# Patient Record
Sex: Female | Born: 1945
Health system: Southern US, Community
[De-identification: ages and names within clinical notes are randomized; demographics above are authoritative.]

## PROBLEM LIST (undated history)

## (undated) DIAGNOSIS — I471 Supraventricular tachycardia, unspecified: Secondary | ICD-10-CM

## (undated) DIAGNOSIS — E785 Hyperlipidemia, unspecified: Secondary | ICD-10-CM

## (undated) DIAGNOSIS — R001 Bradycardia, unspecified: Secondary | ICD-10-CM

## (undated) DIAGNOSIS — E559 Vitamin D deficiency, unspecified: Secondary | ICD-10-CM

## (undated) DIAGNOSIS — R413 Other amnesia: Secondary | ICD-10-CM

## (undated) DIAGNOSIS — I10 Essential (primary) hypertension: Secondary | ICD-10-CM

## (undated) DIAGNOSIS — F0390 Unspecified dementia without behavioral disturbance: Secondary | ICD-10-CM

## (undated) DIAGNOSIS — D649 Anemia, unspecified: Secondary | ICD-10-CM

## (undated) DIAGNOSIS — E041 Nontoxic single thyroid nodule: Secondary | ICD-10-CM

## (undated) HISTORY — DX: Nontoxic single thyroid nodule: E04.1

## (undated) HISTORY — DX: Bradycardia, unspecified: R00.1

## (undated) HISTORY — DX: Essential (primary) hypertension: I10

## (undated) HISTORY — DX: Supraventricular tachycardia: I47.1

## (undated) HISTORY — DX: Unspecified dementia, unspecified severity, without behavioral disturbance, psychotic disturbance, mood disturbance, and anxiety: F03.90

## (undated) HISTORY — DX: Anemia, unspecified: D64.9

## (undated) HISTORY — DX: Other amnesia: R41.3

## (undated) HISTORY — DX: Supraventricular tachycardia, unspecified: I47.10

## (undated) HISTORY — DX: Hyperlipidemia, unspecified: E78.5

## (undated) HISTORY — DX: Vitamin D deficiency, unspecified: E55.9

## (undated) HISTORY — PX: OTHER SURGICAL HISTORY: SHX169

---

## 2012-09-16 ENCOUNTER — Encounter: Payer: Self-pay | Admitting: Cardiology

## 2012-09-18 ENCOUNTER — Ambulatory Visit (INDEPENDENT_AMBULATORY_CARE_PROVIDER_SITE_OTHER): Payer: Medicare Other | Admitting: Cardiology

## 2012-09-18 ENCOUNTER — Encounter: Payer: Self-pay | Admitting: Cardiology

## 2012-09-18 VITALS — BP 142/84 | HR 60 | Ht 62.0 in | Wt 151.0 lb

## 2012-09-18 DIAGNOSIS — I10 Essential (primary) hypertension: Secondary | ICD-10-CM

## 2012-09-18 DIAGNOSIS — I471 Supraventricular tachycardia: Secondary | ICD-10-CM

## 2012-09-18 DIAGNOSIS — R002 Palpitations: Secondary | ICD-10-CM | POA: Insufficient documentation

## 2012-09-18 DIAGNOSIS — I498 Other specified cardiac arrhythmias: Secondary | ICD-10-CM

## 2012-09-18 NOTE — Progress Notes (Signed)
  HPI: 66 year old female for evaluation of palpitations. Patient apparently has a history of supraventricular tachycardia. I do not have those records available. Previous echocardiogram in 1998 showed normal LV function. Patient has had intermittent palpitations since her 26s. These are frequent and can occur up to once per week. They're sudden in onset and described as her heart racing. She feels somewhat weak and a numb sensation in her head but there is no syncope, chest pain or dyspnea. The length of these episodes varies. She denies dyspnea on exertion, orthopnea, PND, pedal edema or exertional chest pain. Because of the above we were asked to evaluate.  Current Outpatient Prescriptions  Medication Sig Dispense Refill  . lisinopril-hydrochlorothiazide (PRINZIDE,ZESTORETIC) 20-25 MG per tablet Take 1 tablet by mouth daily.      . methocarbamol (ROBAXIN) 500 MG tablet Take 250-500 mg by mouth at bedtime as needed.      . potassium chloride (K-DUR,KLOR-CON) 10 MEQ tablet Take 10 mEq by mouth 2 (two) times daily.         No Known Allergies  Past Medical History  Diagnosis Date  . Hypertension   . Tachycardia   . Hyperlipidemia   . Thyroid nodule     Past Surgical History  Procedure Date  . Hysterectomy----unknown     History   Social History  . Marital Status: Married    Spouse Name: N/A    Number of Children: 3  . Years of Education: N/A   Occupational History  . RETIRED    Social History Main Topics  . Smoking status: Former Smoker -- 0.5 packs/day for 10 years    Types: Cigarettes    Quit date: 09/18/1977  . Smokeless tobacco: Not on file  . Alcohol Use: No  . Drug Use: No  . Sexually Active: Not on file   Other Topics Concern  . Not on file   Social History Narrative  . No narrative on file    Family History  Problem Relation Age of Onset  . Ovarian cancer Sister   . Lung cancer Brother   . Heart disease Mother     Rhythm problem    ROS: no fevers or  chills, productive cough, hemoptysis, dysphasia, odynophagia, melena, hematochezia, dysuria, hematuria, rash, seizure activity, orthopnea, PND, pedal edema, claudication. Remaining systems are negative.  Physical Exam:   Blood pressure 142/84, pulse 60, height 5\' 2"  (1.575 m), weight 151 lb (68.493 kg).  General:  Well developed/well nourished in NAD Skin warm/dry Patient not depressed No peripheral clubbing Back-normal HEENT-normal/normal eyelids Neck supple/normal carotid upstroke bilaterally; no bruits; no JVD; no thyromegaly chest - CTA/ normal expansion CV - RRR/normal S1 and S2; no murmurs, rubs or gallops;  PMI nondisplaced Abdomen -NT/ND, no HSM, no mass, + bowel sounds, no bruit 2+ femoral pulses, no bruits Ext-no edema, chords, 2+ DP Neuro-grossly nonfocal  ECG 08/23/2012-sinus bradycardia at a rate of 46 and nonspecific ST changes.

## 2012-09-18 NOTE — Assessment & Plan Note (Signed)
Patient symptoms are concerning for supraventricular tachycardia. She apparently has been diagnosed with this in the past but we do not have those records. Plan echocardiogram to assess LV function. CardioNet to identify arrhythmia. If SVT identified she will need an ablation. I am unable had a beta blocker as her resting heart rate previously was in the 40s.

## 2012-09-18 NOTE — Patient Instructions (Signed)
   E-cardio 21 day heart monitor  Echo  Office will contact with results Continue all current medications. Follow up in  6 weeks

## 2012-09-18 NOTE — Assessment & Plan Note (Signed)
Continue present blood pressure medications. 

## 2012-09-26 ENCOUNTER — Other Ambulatory Visit (INDEPENDENT_AMBULATORY_CARE_PROVIDER_SITE_OTHER): Payer: Medicare Other

## 2012-09-26 ENCOUNTER — Other Ambulatory Visit: Payer: Self-pay

## 2012-09-26 DIAGNOSIS — I471 Supraventricular tachycardia: Secondary | ICD-10-CM

## 2012-09-26 DIAGNOSIS — I369 Nonrheumatic tricuspid valve disorder, unspecified: Secondary | ICD-10-CM

## 2012-09-26 DIAGNOSIS — R002 Palpitations: Secondary | ICD-10-CM

## 2012-09-27 ENCOUNTER — Encounter: Payer: Self-pay | Admitting: *Deleted

## 2012-11-12 ENCOUNTER — Ambulatory Visit: Payer: Medicare Other | Admitting: Cardiology

## 2012-11-14 ENCOUNTER — Encounter: Payer: Self-pay | Admitting: Cardiology

## 2012-11-14 ENCOUNTER — Ambulatory Visit (INDEPENDENT_AMBULATORY_CARE_PROVIDER_SITE_OTHER): Payer: Medicare Other | Admitting: Cardiology

## 2012-11-14 VITALS — BP 161/88 | HR 79 | Ht 62.0 in | Wt 155.0 lb

## 2012-11-14 DIAGNOSIS — R002 Palpitations: Secondary | ICD-10-CM

## 2012-11-14 NOTE — Progress Notes (Signed)
   HPI: 67 year old female I initially saw in Nov 2013 for evaluation of palpitations. Patient apparently has a history of supraventricular tachycardia. I do not have those records available. Echocardiogram in November of 2013 showed an ejection fraction of 65-70%, mild left ventricular hypertrophy, grade 1 diastolic dysfunction, mild left atrial enlargement, mild mitral regurgitation and trace aortic/mitral insufficiency. Event monitor ordered but not performed. Since I last saw her she continues to have occasional palpitations. She describes this as a fluttering sensation in her neck. It can last several hours to all day. She otherwise denies dyspnea on exertion, orthopnea, PND, chest pain or syncope.   Current Outpatient Prescriptions  Medication Sig Dispense Refill  . lisinopril-hydrochlorothiazide (PRINZIDE,ZESTORETIC) 20-25 MG per tablet Take 1 tablet by mouth daily.      . methocarbamol (ROBAXIN) 500 MG tablet Take 250-500 mg by mouth at bedtime as needed.      . potassium chloride (K-DUR,KLOR-CON) 10 MEQ tablet Take 10 mEq by mouth 2 (two) times daily.          Past Medical History  Diagnosis Date  . Hypertension   . Tachycardia   . Hyperlipidemia   . Thyroid nodule     Past Surgical History  Procedure Date  . Hysterectomy----unknown     History   Social History  . Marital Status: Married    Spouse Name: N/A    Number of Children: 3  . Years of Education: N/A   Occupational History  . RETIRED    Social History Main Topics  . Smoking status: Former Smoker -- 0.5 packs/day for 10 years    Types: Cigarettes    Quit date: 09/18/1977  . Smokeless tobacco: Not on file  . Alcohol Use: No  . Drug Use: No  . Sexually Active: Not on file   Other Topics Concern  . Not on file   Social History Narrative  . No narrative on file    ROS: Arthralgias but no fevers or chills, productive cough, hemoptysis, dysphasia, odynophagia, melena, hematochezia, dysuria, hematuria,  rash, seizure activity, orthopnea, PND, pedal edema, claudication. Remaining systems are negative.  Physical Exam: Well-developed well-nourished in no acute distress.  Skin is warm and dry.  HEENT is normal.  Neck is supple.  Chest is clear to auscultation with normal expansion.  Cardiovascular exam is regular rate and rhythm.  Abdominal exam nontender or distended. No masses palpated. Extremities show no edema. neuro grossly intact

## 2012-11-14 NOTE — Assessment & Plan Note (Signed)
Patient continues to have intermittent palpitations. She sounds as though she may be having SVT. LV function is normal. Previous monitor not performed. Will arrange a 21 day CardioNet. If she has SVT she would benefit from ablation. I have not added a beta blocker as her heart rate was in the 40s previously.

## 2012-11-14 NOTE — Assessment & Plan Note (Addendum)
Blood pressure mildly elevated. Continue present medications. Monitor and increase as needed.

## 2012-11-14 NOTE — Patient Instructions (Signed)
   21 day e-cardio heart monitor  Office will contact with results Follow up in  4 weeks

## 2012-11-15 ENCOUNTER — Other Ambulatory Visit: Payer: Self-pay | Admitting: *Deleted

## 2012-11-15 DIAGNOSIS — R002 Palpitations: Secondary | ICD-10-CM

## 2012-11-20 DIAGNOSIS — R002 Palpitations: Secondary | ICD-10-CM

## 2012-11-29 ENCOUNTER — Telehealth: Payer: Self-pay | Admitting: *Deleted

## 2012-11-29 MED ORDER — METOPROLOL TARTRATE 25 MG PO TABS: 25.0000 mg | ORAL_TABLET | Freq: Two times a day (BID) | ORAL | Status: DC

## 2012-11-29 NOTE — Telephone Encounter (Signed)
E-Cardio - monitor reading SVT with heart rate of 163 on 11/28/2012 at 11:11 a.m.   Phone call to patient - stated she did feel slightly dizzy and head felt dull during episode.  No other complaints at this time and states she is feeling fine now.    Gene Serpe, PA notified of above & strips reviewed.  Begin Lopressor 25mg  twice a day.  Follow up at [facility] on 2/3.    Patient informed of above and verbalized understanding.  Will send new rx to [facility] [place].

## 2012-12-16 ENCOUNTER — Ambulatory Visit (INDEPENDENT_AMBULATORY_CARE_PROVIDER_SITE_OTHER): Payer: Medicare Other | Admitting: Physician Assistant

## 2012-12-16 ENCOUNTER — Encounter: Payer: Self-pay | Admitting: Physician Assistant

## 2012-12-16 VITALS — BP 172/82 | HR 43 | Ht 62.0 in | Wt 153.0 lb

## 2012-12-16 DIAGNOSIS — I471 Supraventricular tachycardia, unspecified: Secondary | ICD-10-CM | POA: Insufficient documentation

## 2012-12-16 DIAGNOSIS — R001 Bradycardia, unspecified: Secondary | ICD-10-CM

## 2012-12-16 DIAGNOSIS — I10 Essential (primary) hypertension: Secondary | ICD-10-CM

## 2012-12-16 DIAGNOSIS — I498 Other specified cardiac arrhythmias: Secondary | ICD-10-CM

## 2012-12-16 NOTE — Patient Instructions (Signed)
Continue all current medications. Referral to Dr. Johney Frame - [place] office Follow up as needed

## 2012-12-16 NOTE — Assessment & Plan Note (Signed)
Followed by primary M.D. 

## 2012-12-16 NOTE — Assessment & Plan Note (Signed)
Results of recent event monitor were reviewed with Dr. Myrtis Ser, who confirmed that patient had demonstrated bursts of SVT. Recommend referral to Dr. Hillis Range of our EP team for confirmation of the tachyarrhythmia, as well as further recommendations regarding treatment options, including possible RF ablation.

## 2012-12-16 NOTE — Progress Notes (Signed)
Primary Cardiologist: Olga Millers, MD   HPI: Schedule one-month followup for review of event monitor results, per Dr. Jens Som, for evaluation of palpitations.  Patient reports significant decrease in frequency and duration of palpitations, since I started her on Lopressor 25 twice a day. This was following review of the event monitor results, which clearly demonstrated bursts of narrow complex tachycardia ranging from 145-185 bpm. Since starting the beta blocker, she has had only one prolonged episode of approximately 15 seconds, with no significant associated symptoms. She denies any near-syncope/syncope.   Twelve-lead EKG today, reviewed by me, reveals marked SB 43 bpm; LAD  No Known Allergies  Current Outpatient Prescriptions  Medication Sig Dispense Refill  . lisinopril-hydrochlorothiazide (PRINZIDE,ZESTORETIC) 20-25 MG per tablet Take 1 tablet by mouth daily.      . methocarbamol (ROBAXIN) 500 MG tablet Take 250-500 mg by mouth at bedtime as needed.      . metoprolol tartrate (LOPRESSOR) 25 MG tablet Take 1 tablet (25 mg total) by mouth 2 (two) times daily.  60 tablet  3  . Multiple Vitamins-Minerals (MULTIVITAMIN PO) Take 1 tablet by mouth daily.      . potassium chloride (K-DUR,KLOR-CON) 10 MEQ tablet Take 10 mEq by mouth 2 (two) times daily.         Past Medical History  Diagnosis Date  . Hypertension   . Sinus bradycardia   . Hyperlipidemia   . Thyroid nodule   . SVT (supraventricular tachycardia)     Event monitor, 11/2012    Past Surgical History  Procedure Date  . Hysterectomy----unknown     History   Social History  . Marital Status: Married    Spouse Name: N/A    Number of Children: 3  . Years of Education: N/A   Occupational History  . RETIRED    Social History Main Topics  . Smoking status: Former Smoker -- 0.5 packs/day for 10 years    Types: Cigarettes    Quit date: 09/18/1977  . Smokeless tobacco: Not on file  . Alcohol Use: No  . Drug Use:  No  . Sexually Active: Not on file   Other Topics Concern  . Not on file   Social History Narrative  . No narrative on file    Family History  Problem Relation Age of Onset  . Ovarian cancer Sister   . Lung cancer Brother   . Heart disease Mother     Rhythm problem    ROS: no nausea, vomiting; no fever, chills; no melena, hematochezia; no claudication  PHYSICAL EXAM: BP 172/82  Pulse 43  Ht 5\' 2"  (1.575 m)  Wt 153 lb (69.4 kg)  BMI 27.98 kg/m2  SpO2 97% GENERAL: 67 year old female; NAD HEENT: NCAT, PERRLA, EOMI; sclera clear; no xanthelasma NECK: palpable bilateral carotid pulses, no bruits; no JVD; no TM LUNGS: CTA bilaterally CARDIAC: RRR (S1, S2); no significant murmurs; no rubs or gallops ABDOMEN: soft, non-tender; intact BS EXTREMETIES: no significant peripheral edema SKIN: warm/dry; no obvious rash/lesions MUSCULOSKELETAL: no joint deformity NEURO: no focal deficit; NL affect   EKG: reviewed and available in Electronic Records   ASSESSMENT & PLAN:  SVT (supraventricular tachycardia) Results of recent event monitor were reviewed with Dr. Myrtis Ser, who confirmed that patient had demonstrated bursts of SVT. Recommend referral to Dr. Hillis Range of our EP team for confirmation of the tachyarrhythmia, as well as further recommendations regarding treatment options, including possible RF ablation.  Sinus bradycardia Will decrease Lopressor to 12.5 twice a day, given  development of marked SB in the 40s. Of note, however, patient does report significant improvement of her tachycardia palpitations, since being placed on this medication.  Hypertension Followed by primary M.D.    Gene Jaspreet Hollings, PAC

## 2012-12-16 NOTE — Assessment & Plan Note (Signed)
Will decrease Lopressor to 12.5 twice a day, given development of marked SB in the 40s. Of note, however, patient does report significant improvement of her tachycardia palpitations, since being placed on this medication.

## 2012-12-31 ENCOUNTER — Encounter: Payer: Self-pay | Admitting: Internal Medicine

## 2012-12-31 ENCOUNTER — Ambulatory Visit (INDEPENDENT_AMBULATORY_CARE_PROVIDER_SITE_OTHER): Payer: Medicare Other | Admitting: Internal Medicine

## 2012-12-31 VITALS — BP 187/85 | HR 51 | Ht 61.0 in | Wt 153.0 lb

## 2012-12-31 DIAGNOSIS — I498 Other specified cardiac arrhythmias: Secondary | ICD-10-CM

## 2012-12-31 DIAGNOSIS — I471 Supraventricular tachycardia: Secondary | ICD-10-CM

## 2012-12-31 DIAGNOSIS — I10 Essential (primary) hypertension: Secondary | ICD-10-CM

## 2012-12-31 MED ORDER — AMLODIPINE BESYLATE 2.5 MG PO TABS: 2.5000 mg | ORAL_TABLET | Freq: Every day | ORAL | Status: AC

## 2012-12-31 NOTE — Progress Notes (Signed)
ELECTROPHYSIOLOGY CONSULT NOTE  Patient ID: Taylor Perkins, MRN: [id number], DOB/AGE: [birth year] 67 y.o. Admit date: (Not on file) Date of Consult: 12/31/2012  Primary Physician: Louie Boston, MD Primary Cardiologist: Jonita Albee  Chief Complaint:  SVT    HPI Taylor Perkins is a 67 y.o. female *    ** Past Medical History  Diagnosis Date  . Hypertension   . Sinus bradycardia   . Hyperlipidemia   . Thyroid nodule   . SVT (supraventricular tachycardia)     Event monitor, 11/2012      Surgical History:  Past Surgical History  Procedure Laterality Date  . Hysterectomy----unknown       Home Meds: Prior to Admission medications   Medication Sig Start Date End Date Taking? Authorizing Provider  lisinopril-hydrochlorothiazide (PRINZIDE,ZESTORETIC) 20-25 MG per tablet Take 1 tablet by mouth daily.   Yes Historical Provider, MD  methocarbamol (ROBAXIN) 500 MG tablet Take 250-500 mg by mouth at bedtime as needed.   Yes Historical Provider, MD  metoprolol tartrate (LOPRESSOR) 25 MG tablet Take 1 tablet (25 mg total) by mouth 2 (two) times daily. 11/29/12  Yes Prescott Parma, PA  Multiple Vitamins-Minerals (MULTIVITAMIN PO) Take 1 tablet by mouth daily.   Yes Historical Provider, MD  potassium chloride (K-DUR,KLOR-CON) 10 MEQ tablet Take 10 mEq by mouth 2 (two) times daily.    Yes Historical Provider, MD     Allergies: No Known Allergies  History   Social History  . Marital Status: Married    Spouse Name: N/A    Number of Children: 3  . Years of Education: N/A   Occupational History  . RETIRED    Social History Main Topics  . Smoking status: Former Smoker -- 0.50 packs/day for 10 years    Types: Cigarettes    Quit date: 09/18/1977  . Smokeless tobacco: Not on file  . Alcohol Use: No  . Drug Use: No  . Sexually Active: Not on file   Other Topics Concern  . Not on file   Social History Narrative  . No narrative on file     Family History  Problem Relation  Age of Onset  . Ovarian cancer Sister   . Lung cancer Brother   . Heart disease Mother     Rhythm problem     ROS:  Please see the history of present illness.   Negative except arthritis and exotropia  All other systems reviewed and negative.    Physical Exam:   Blood pressure 187/85, pulse 51, height 5\' 1"  (1.549 m), weight 153 lb (69.4 kg). General: Well developed, well nourished female in no acute distress. Head: Normocephalic, atraumatic, sclera non-icteric, no xanthomas, nares are without discharge. EENT exotropia Lymph Nodes:  none Back: without scoliosis/kyphosis , no CVA tendersness Neck: Negative for carotid bruits. JVD not elevated. Lungs: Clear bilaterally to auscultation without wheezes, rales, or rhonchi. Breathing is unlabored. Heart: RRR with S1 S2. No  murmur , rubs,+s4 Abdomen: Soft, non-tender, non-distended with normoactive bowel sounds. No hepatomegaly. No rebound/guarding. No obvious abdominal masses. Msk:  Strength and tone appear normal for age. Extremities: No clubbing or cyanosis. No edema.  Distal pedal pulses are 2+ and equal bilaterally. Skin: Warm and Dry Neuro: Alert and oriented X 3. CN III-XII intact Grossly normal sensory and motor function . Psych:  Responds to questions appropriately with a normal affect.      Labs:   Radiology/Studies:  No results found.  EKG:  NSR without preexcitation  Assessment and Plan:    Sherryl Manges

## 2012-12-31 NOTE — Assessment & Plan Note (Signed)
Poorly controlled will add amlodipine and she will follow up with her PCP

## 2012-12-31 NOTE — Assessment & Plan Note (Signed)
Discussed treatment options including ongoing medications and catheter ablation. We discussed that treatment is life style choice and that the SVT is unlikely to be lifethreatening.  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

## 2012-12-31 NOTE — Patient Instructions (Addendum)
Your physician wants you to follow-up in: 6 months with Dr Logan Bores will receive a reminder letter in the mail two months in advance. If you don't receive a letter, please call our office to schedule the follow-up appointment.  Your physician has recommended you make the following change in your medication: START Amlodipine 2.5 mg daily

## 2013-04-28 ENCOUNTER — Other Ambulatory Visit: Payer: Self-pay | Admitting: *Deleted

## 2013-04-28 MED ORDER — METOPROLOL TARTRATE 25 MG PO TABS: 25.0000 mg | ORAL_TABLET | Freq: Two times a day (BID) | ORAL | Status: DC

## 2014-01-10 ENCOUNTER — Other Ambulatory Visit: Payer: Self-pay | Admitting: Physician Assistant

## 2014-01-19 ENCOUNTER — Other Ambulatory Visit: Payer: Self-pay | Admitting: Physician Assistant

## 2015-09-06 ENCOUNTER — Ambulatory Visit: Payer: Medicare Other | Admitting: Neurology

## 2016-12-20 ENCOUNTER — Encounter: Payer: Self-pay | Admitting: Neurology

## 2016-12-20 ENCOUNTER — Ambulatory Visit (INDEPENDENT_AMBULATORY_CARE_PROVIDER_SITE_OTHER): Payer: Medicare Other | Admitting: Neurology

## 2016-12-20 ENCOUNTER — Ambulatory Visit: Payer: Medicare Other | Admitting: Neurology

## 2016-12-20 VITALS — BP 170/94 | HR 54 | Ht 61.0 in | Wt 160.5 lb

## 2016-12-20 DIAGNOSIS — R413 Other amnesia: Secondary | ICD-10-CM | POA: Diagnosis not present

## 2016-12-20 DIAGNOSIS — E538 Deficiency of other specified B group vitamins: Secondary | ICD-10-CM | POA: Diagnosis not present

## 2016-12-20 HISTORY — DX: Other amnesia: R41.3

## 2016-12-20 MED ORDER — DONEPEZIL HCL 5 MG PO TABS: 5.0000 mg | ORAL_TABLET | Freq: Every day | ORAL | 1 refills | Status: DC

## 2016-12-20 NOTE — Patient Instructions (Signed)
   We will check blood work today and get MRI of the brain. We will start Aricept for memory.  Begin Aricept (donepezil) at 5 mg at night for one month. If this medication is well-tolerated, please call our office and we will call in a prescription for the 10 mg tablets. Look out for side effects that may include nausea, diarrhea, weight loss, or stomach cramps. This medication will also cause a runny nose, therefore there is no need for allergy medications for this purpose.

## 2016-12-20 NOTE — Progress Notes (Signed)
Reason for visit: Memory disturbance  Referring physician: Dr. Delphina Cahill Taylor Perkins is a 71 y.o. female  History of present illness:  Taylor Perkins is a 70 year old right-handed black female with a history of reported memory problems dating back 6-12 months. The patient has noted that she is forgetful, she cannot remember some things that occurred recently. She has some word finding problems and some difficulty remembering names for people and things. She does operate a Librarian, academic, she denies any safety issues with this, she denies any problems with directions or problems finding her car when she parks in front of a store. The patient manages her own medications and appointments without difficulty. She denies any problems with cooking. She may sometimes misplace things about the house. The patient denies any problems with fatigue, she sleeps well at night. She denies any numbness or weakness of the face, arms, or legs. She does have some urinary urgency and occasional incontinence but this has been a problem that has been present for several years. She denies any balance issues or any falls. She is concerned about her memory, she indicates that her mother and one of her sisters had some memory problems. The patient comes to this office for an evaluation.  Past Medical History:  Diagnosis Date  . Hyperlipidemia   . Hypertension   . Memory disorder 12/20/2016  . Sinus bradycardia   . SVT (supraventricular tachycardia) (HCC)    Event monitor, 11/2012  . Thyroid nodule     Past Surgical History:  Procedure Laterality Date  . hysterectomy----unknown      Family History  Problem Relation Age of Onset  . Ovarian cancer Sister   . Lung cancer Brother   . Heart disease Mother     Rhythm problem  . Dementia Mother   . Dementia Sister     Social history:  reports that she quit smoking about 39 years ago. Her smoking use included Cigarettes. She has a 5.00 pack-year smoking history.  She has never used smokeless tobacco. She reports that she does not drink alcohol or use drugs.  Medications:  Prior to Admission medications   Medication Sig Start Date End Date Taking? Authorizing Provider  amLODipine (NORVASC) 2.5 MG tablet Take 1 tablet (2.5 mg total) by mouth daily. 12/31/12  Yes Duke Salvia, MD  lisinopril-hydrochlorothiazide (PRINZIDE,ZESTORETIC) 20-25 MG per tablet Take 1 tablet by mouth daily.   Yes Historical Provider, MD  methocarbamol (ROBAXIN) 500 MG tablet Take 250-500 mg by mouth at bedtime as needed.   Yes Historical Provider, MD  metoprolol tartrate (LOPRESSOR) 25 MG tablet TAKE ONE TABLET BY MOUTH TWICE DAILY   Yes Duke Salvia, MD  metoprolol tartrate (LOPRESSOR) 25 MG tablet TAKE ONE TABLET BY MOUTH TWICE DAILY 01/19/14  Yes Duke Salvia, MD  Multiple Vitamins-Minerals (MULTIVITAMIN PO) Take 1 tablet by mouth daily.   Yes Historical Provider, MD  potassium chloride (K-DUR,KLOR-CON) 10 MEQ tablet Take 10 mEq by mouth 2 (two) times daily.    Yes Historical Provider, MD  donepezil (ARICEPT) 5 MG tablet Take 1 tablet (5 mg total) by mouth at bedtime. 12/20/16   York Spaniel, MD     No Known Allergies  ROS:  Out of a complete 14 system review of symptoms, the patient complains only of the following symptoms, and all other reviewed systems are negative.  Memory loss Change in appetite  Blood pressure (!) 170/94, pulse (!) 54, height 5\' 1"  (1.549 m),  weight 160 lb 8 oz (72.8 kg).  Physical Exam  General: The patient is alert and cooperative at the time of the examination.  Eyes: Pupils are equal, round, and reactive to light. Discs are flat bilaterally.  Neck: The neck is supple, no carotid bruits are noted.  Respiratory: The respiratory examination is clear.  Cardiovascular: The cardiovascular examination reveals a regular rate and rhythm, no obvious murmurs or rubs are noted.  Skin: Extremities are without significant edema.  Neurologic  Exam  Mental status: The patient is alert and oriented x 3 at the time of the examination. The patient has apparent normal recent and remote memory, with an apparently normal attention span and concentration ability. Mini-Mental Status Examination done today shows a total score 25/30. The patient is able to name 5 animals with 4 legs in one minute.  Cranial nerves: Facial symmetry is present. There is good sensation of the face to pinprick and soft touch bilaterally. The strength of the facial muscles and the muscles to head turning and shoulder shrug are normal bilaterally. Speech is well enunciated, no aphasia or dysarthria is noted. Extraocular movements are full with the right eye. On primary gaze, there is exotropia of the left eye, she cannot bring the left eye all the way across to the right. With superior gaze, the left eye is laterally deviated. Visual fields are full. The tongue is midline, and the patient has symmetric elevation of the soft palate. No obvious hearing deficits are noted.  Motor: The motor testing reveals 5 over 5 strength of all 4 extremities. Good symmetric motor tone is noted throughout.  Sensory: Sensory testing is intact to pinprick, soft touch, vibration sensation, and position sense on all 4 extremities. No evidence of extinction is noted.  Coordination: Cerebellar testing reveals good finger-nose-finger and heel-to-shin bilaterally.  Gait and station: Gait is normal. Tandem gait is normal. Romberg is negative. No drift is seen.  Reflexes: Deep tendon reflexes are symmetric, but are depressed bilaterally. Toes are downgoing bilaterally.   Assessment/Plan:  1. Memory disturbance  The patient is developing a memory issue over the last year or so. The patient will be set up for blood work today, she will have MRI of the brain. We will start Aricept in low dose, she will call after one month if she is tolerating the medication and we will call in a maintenance dose  prescription for her. She will follow-up in 6 months.  Marlan Palau. Keith Christena Sunderlin MD 12/20/2016 8:55 AM  [facility] [address] [place], [place] [id number]  Phone [phone] Fax [phone]

## 2016-12-21 LAB — RPR: RPR: NONREACTIVE

## 2016-12-21 LAB — SEDIMENTATION RATE: Sed Rate: 10 mm/hr (ref 0–40)

## 2016-12-21 LAB — VITAMIN B12: Vitamin B-12: 687 pg/mL (ref 232–1245)

## 2016-12-25 ENCOUNTER — Telehealth: Payer: Self-pay | Admitting: Neurology

## 2016-12-25 ENCOUNTER — Telehealth: Payer: Self-pay | Admitting: *Deleted

## 2016-12-25 NOTE — Telephone Encounter (Signed)
I called patient. The patient had [facility]. Study shows mild to moderate small vessel disease, the patient does have a history of hypertension. I have asked her to go on a low-dose aspirin.  C4-5 degenerative changes are also noted. I discussed the results of the MRI with her.  The patient is on Aricept 5 mg, if she tolerates this for 30 days, she is to call for a prescription for the 10 mg tablet.

## 2016-12-25 NOTE — Telephone Encounter (Signed)
Called and spoke to patient. Advised if she is in area, she can drop CD off to be reviewed in our office. She states she lives about an hour away and [facility]. She is happy to drop CD off sometime this week if we cannot get a [facility]. Advised I will call [facility] and see if we can get a copy. She verbalized understanding.

## 2016-12-25 NOTE — Telephone Encounter (Signed)
Taylor Perkins- This is the patient I spoke with you about requesting copy MRI CD/report, thank you!

## 2016-12-25 NOTE — Telephone Encounter (Signed)
Called and spoke to pt about lab results per CW,MD note. She verbalized understanding.  

## 2016-12-25 NOTE — Telephone Encounter (Signed)
Dr Anne Hahnwillis- [facility] in medical records requested MRI CD. They are mailed a copy. I gave you MRI report they faxed over for review, thank you.

## 2016-12-25 NOTE — Telephone Encounter (Signed)
Called and spoke to pt. Advised our medical records department is going to request copy of CD/report. If there are any questions/problems we will call her back. She verbalized understanding.

## 2016-12-25 NOTE — Telephone Encounter (Signed)
Patient had MRI last Friday at [facility] and was given CD of MRI. She wants to know what she should do with the CD.

## 2016-12-25 NOTE — Telephone Encounter (Signed)
-----   Message from York Spanielharles K Willis, MD sent at 12/23/2016 12:28 PM EST -----  The blood work results are unremarkable. Please call the patient.  ----- Message ----- From: Nell RangeInterface, [facility] Lab Results In Sent: 12/21/2016   5:42 AM To: York Spanielharles K Willis, MD

## 2017-06-21 ENCOUNTER — Ambulatory Visit: Payer: Medicare Other | Admitting: Adult Health

## 2017-10-03 ENCOUNTER — Other Ambulatory Visit: Payer: Self-pay | Admitting: Neurology

## 2017-10-03 DIAGNOSIS — R413 Other amnesia: Secondary | ICD-10-CM

## 2017-10-11 ENCOUNTER — Ambulatory Visit
Admission: RE | Admit: 2017-10-11 | Discharge: 2017-10-11 | Disposition: A | Discharge status: Home / Self Care | Payer: Medicare Other | Source: Ambulatory Visit | Attending: Neurology | Admitting: Neurology

## 2017-10-11 DIAGNOSIS — M4802 Spinal stenosis, cervical region: Secondary | ICD-10-CM | POA: Diagnosis not present

## 2017-10-11 DIAGNOSIS — R9082 White matter disease, unspecified: Secondary | ICD-10-CM | POA: Insufficient documentation

## 2017-10-11 DIAGNOSIS — M47892 Other spondylosis, cervical region: Secondary | ICD-10-CM | POA: Diagnosis not present

## 2017-10-11 DIAGNOSIS — R413 Other amnesia: Secondary | ICD-10-CM | POA: Diagnosis present

## 2017-11-13 ENCOUNTER — Other Ambulatory Visit: Payer: Self-pay | Admitting: Neurology

## 2018-08-30 DIAGNOSIS — E7800 Pure hypercholesterolemia, unspecified: Secondary | ICD-10-CM | POA: Diagnosis not present

## 2018-08-30 DIAGNOSIS — Z1159 Encounter for screening for other viral diseases: Secondary | ICD-10-CM | POA: Diagnosis not present

## 2018-08-30 DIAGNOSIS — Z130 Encounter for screening for diseases of the blood and blood-forming organs and certain disorders involving the immune mechanism: Secondary | ICD-10-CM | POA: Diagnosis not present

## 2018-08-30 DIAGNOSIS — Z23 Encounter for immunization: Secondary | ICD-10-CM | POA: Diagnosis not present

## 2018-08-30 DIAGNOSIS — I471 Supraventricular tachycardia: Secondary | ICD-10-CM | POA: Diagnosis not present

## 2018-08-30 DIAGNOSIS — E876 Hypokalemia: Secondary | ICD-10-CM | POA: Diagnosis not present

## 2018-08-30 DIAGNOSIS — Z0000 Encounter for general adult medical examination without abnormal findings: Secondary | ICD-10-CM | POA: Diagnosis not present

## 2018-08-30 DIAGNOSIS — I10 Essential (primary) hypertension: Secondary | ICD-10-CM | POA: Diagnosis not present

## 2018-09-10 IMAGING — MR MR HEAD W/O CM
9 of 10 series · 34 of 48 positions shown · non-contrast
Comparison: None.

CLINICAL DATA: Progressive memory loss over the last 1 year.
Headaches for 3 months.

EXAM:
MRI HEAD WITHOUT CONTRAST
TECHNIQUE: Multiplanar, multiecho pulse sequences of the brain and surrounding
structures were obtained without intravenous contrast.

[Series 2: T1 · sagittal · 5.0mm · 0.47mm/px · 3 of 20 slices shown (1 of 2)]
[im 1/20]
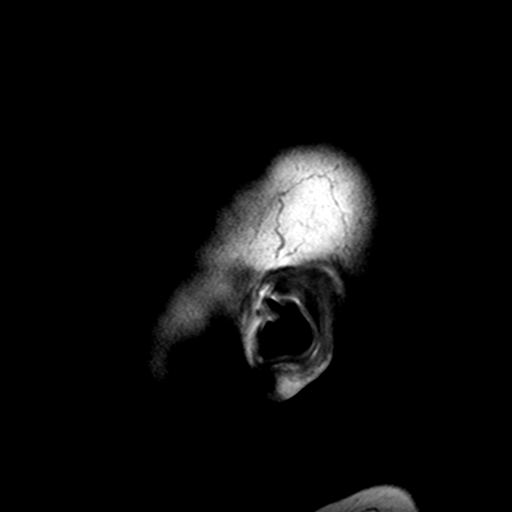
[im 10/20]
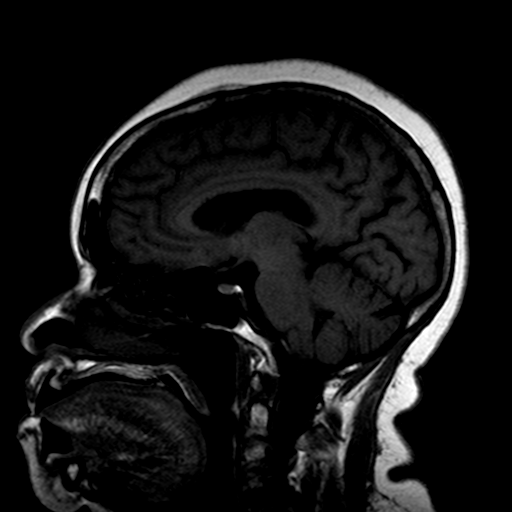
[im 20/20]
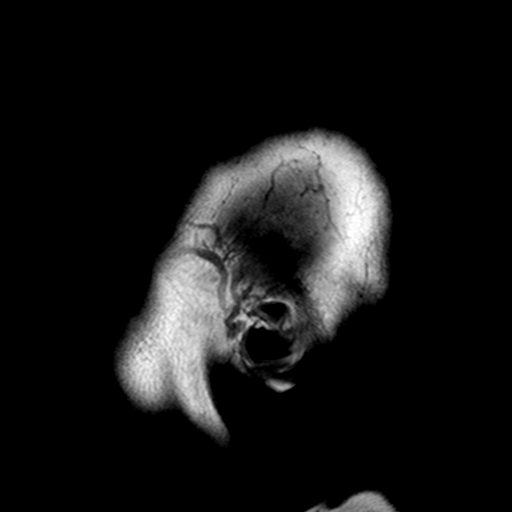

[Series 4: DWI · axial · 3.0mm · 0.94mm/px · z∈[-82,+58]mm · 5 of 48 slices shown (1 of 2)]
[im 1/48]
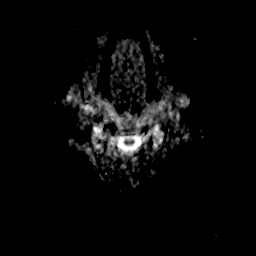
[im 12/48]
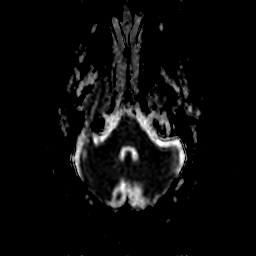
[im 24/48]
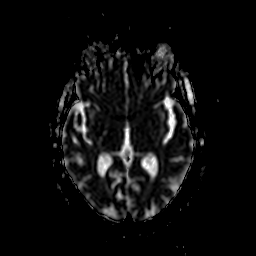
[im 36/48]
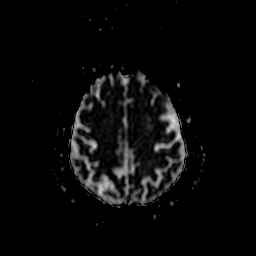
[im 48/48]
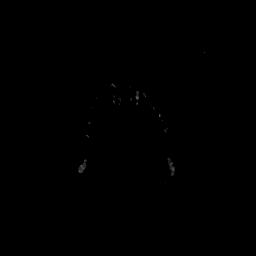

[Series 6: DWI · coronal · 5.0mm · 1.80mm/px · 4 of 35 slices shown (2 of 2)]
[im 1/35]
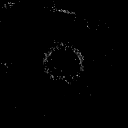
[im 12/35]
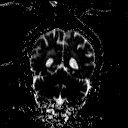
[im 23/35]
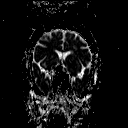
[im 35/35]
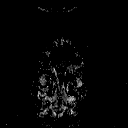

[Series 7: (id) ax · axial · 3.0mm · 0.94mm/px · z∈[-82,-49]mm · 2 of 46 slices shown]
[im 1/46]
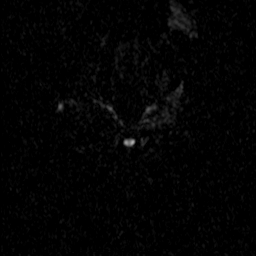
[im 12/46]
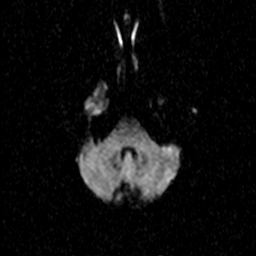

[Series 9: T2 · axial · 5.0mm · 0.45mm/px · z∈[-84,+55]mm · 2 of 21 slices shown (1 of 3)]
[im 1/21]
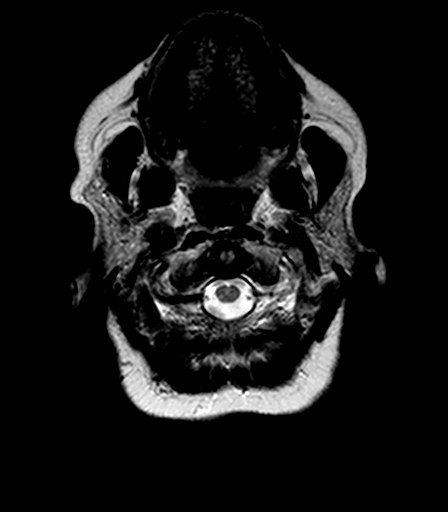
[im 21/21]
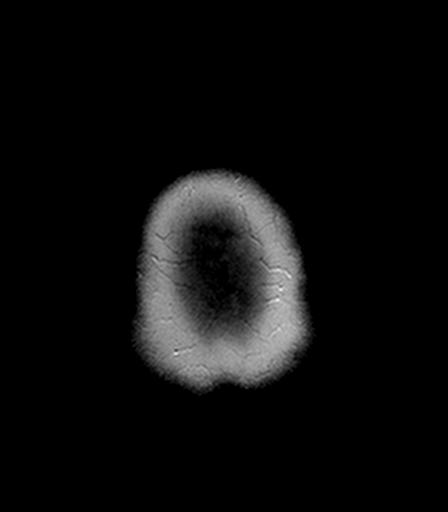

[Series 10: FLAIR · axial · 3.0mm · 0.90mm/px · z∈[-88,+58]mm · 5 of 50 slices shown]
[im 1/50]
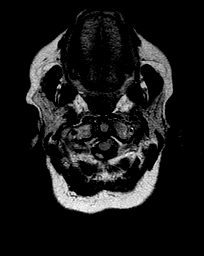
[im 13/50]
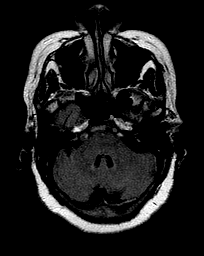
[im 25/50]
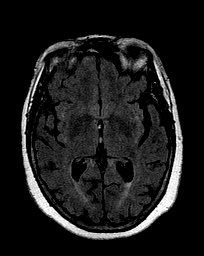
[im 37/50]
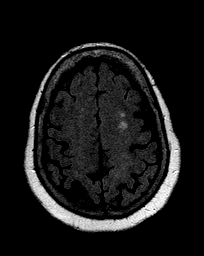
[im 50/50]
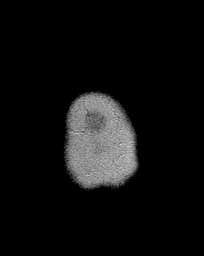

[Series 11: T2 · axial · 5.0mm · 0.45mm/px · z∈[-84,+55]mm · 2 of 21 slices shown (2 of 3)]
[im 1/21]
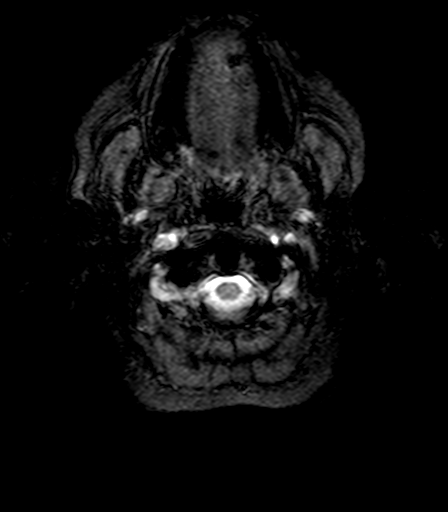
[im 21/21]
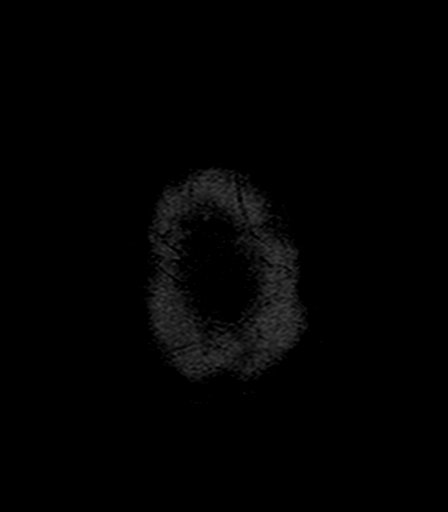

[Series 12: T1 · axial · 1.0mm · 0.45mm/px · z∈[-86,+56]mm · 8 of 144 slices shown (2 of 2)]
[im 1/144]
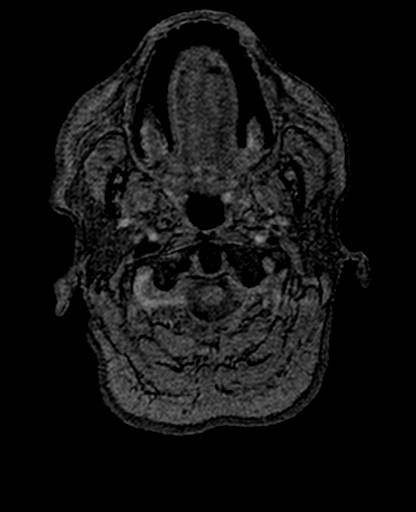
[im 21/144]
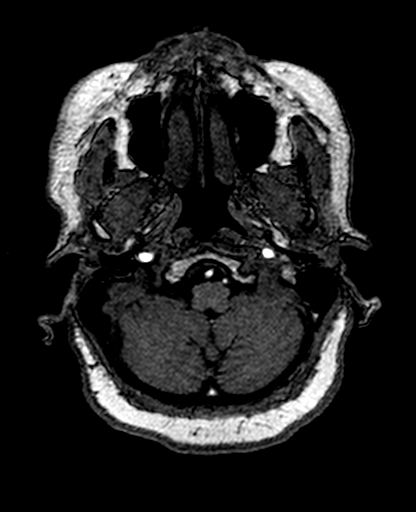
[im 41/144]
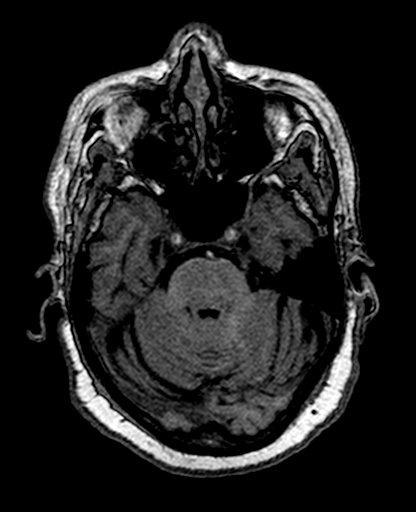
[im 62/144]
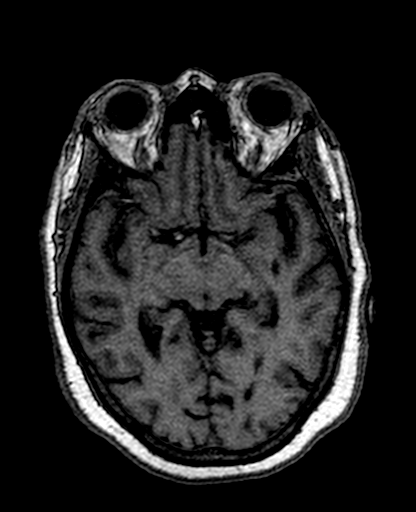
[im 82/144]
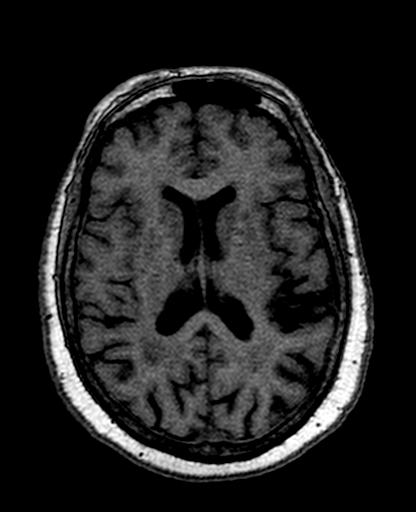
[im 103/144]
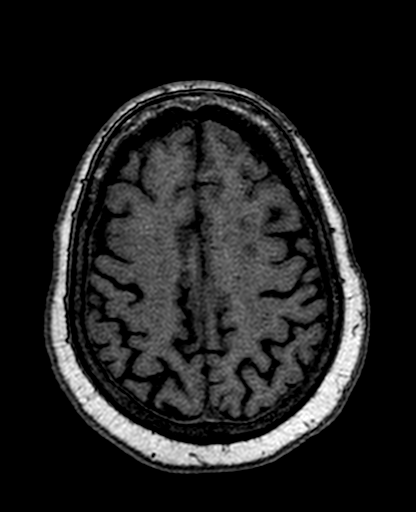
[im 123/144]
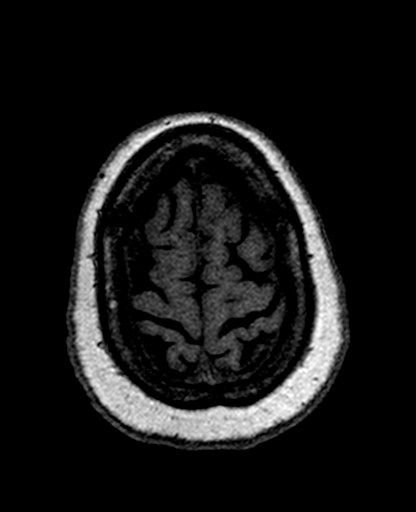
[im 144/144]
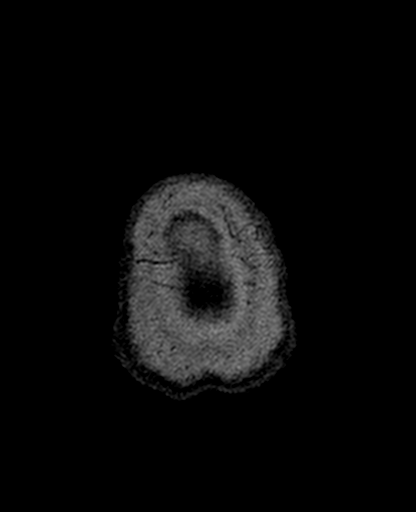

[Series 13: T2 · coronal · 5.0mm · 0.45mm/px · 3 of 27 slices shown (3 of 3)]
[im 1/27]
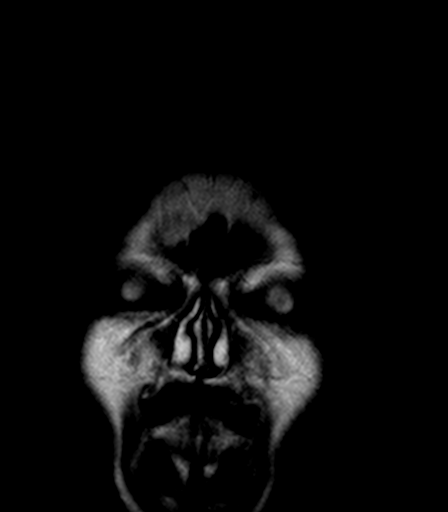
[im 14/27]
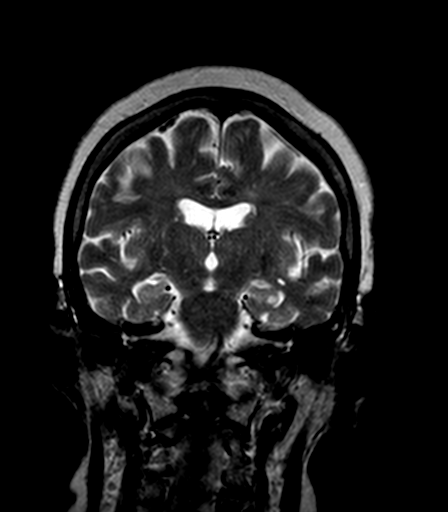
[im 27/27]
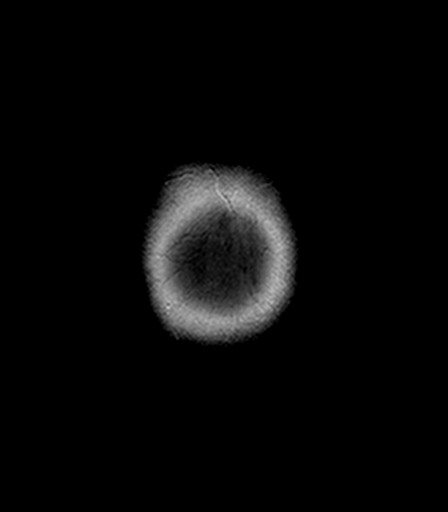

[34 of 48 positions shown; findings below may reference images not displayed]

FINDINGS: Brain: The diffusion-weighted images demonstrate no acute or
subacute infarction. Mild atrophy and moderate white matter disease
is present bilaterally. A remote lacunar infarct is present in the
left lentiform nucleus. Dilated perivascular spaces are present
throughout the basal ganglia. Mild white matter changes extend into
the brainstem. The cerebellum is unremarkable. Subcortical white
matter changes extend into the corona radiata bilaterally.
Ventricles are proportionate to the degree of atrophy. No
significant extra-axial fluid collection is present.

Vascular: Flow is present in the major intracranial arteries.

Skull and upper cervical spine: The craniocervical junction is
normal. Multilevel degenerate changes are noted cervical spine
slight retrolisthesis C3-4. Grade 1 anterolisthesis at C4-5 results
in moderate central canal stenosis. Marrow signal is normal. Midline
sagittal structures are otherwise unremarkable.

Sinuses/Orbits: The paranasal sinuses and mastoid air cells are
clear. Globes and orbits are within normal limits.
IMPRESSION: 1. No acute or focal abnormality to explain progressive memory loss.
2. Moderate diffuse white matter disease, likely related to chronic
microvascular ischemia.
3. Multilevel degenerative changes in the cervical spine with
moderate central canal stenosis at C4-5.

## 2019-04-04 DIAGNOSIS — G301 Alzheimer's disease with late onset: Secondary | ICD-10-CM | POA: Diagnosis not present

## 2019-06-11 DIAGNOSIS — R413 Other amnesia: Secondary | ICD-10-CM | POA: Diagnosis not present

## 2019-06-11 DIAGNOSIS — I10 Essential (primary) hypertension: Secondary | ICD-10-CM | POA: Diagnosis not present

## 2019-09-29 DIAGNOSIS — Z23 Encounter for immunization: Secondary | ICD-10-CM | POA: Diagnosis not present

## 2019-11-05 DIAGNOSIS — E7800 Pure hypercholesterolemia, unspecified: Secondary | ICD-10-CM | POA: Diagnosis not present

## 2019-11-05 DIAGNOSIS — E876 Hypokalemia: Secondary | ICD-10-CM | POA: Diagnosis not present

## 2019-11-05 DIAGNOSIS — I10 Essential (primary) hypertension: Secondary | ICD-10-CM | POA: Diagnosis not present

## 2019-11-05 DIAGNOSIS — Z0000 Encounter for general adult medical examination without abnormal findings: Secondary | ICD-10-CM | POA: Diagnosis not present

## 2019-11-05 DIAGNOSIS — R413 Other amnesia: Secondary | ICD-10-CM | POA: Diagnosis not present

## 2019-11-05 DIAGNOSIS — I471 Supraventricular tachycardia: Secondary | ICD-10-CM | POA: Diagnosis not present

## 2020-11-09 DIAGNOSIS — I471 Supraventricular tachycardia: Secondary | ICD-10-CM | POA: Diagnosis not present

## 2020-11-09 DIAGNOSIS — R7302 Impaired glucose tolerance (oral): Secondary | ICD-10-CM | POA: Diagnosis not present

## 2020-11-09 DIAGNOSIS — Z0000 Encounter for general adult medical examination without abnormal findings: Secondary | ICD-10-CM | POA: Diagnosis not present

## 2020-11-09 DIAGNOSIS — Z13220 Encounter for screening for lipoid disorders: Secondary | ICD-10-CM | POA: Diagnosis not present

## 2020-11-09 DIAGNOSIS — Z136 Encounter for screening for cardiovascular disorders: Secondary | ICD-10-CM | POA: Diagnosis not present

## 2020-11-22 DIAGNOSIS — Z23 Encounter for immunization: Secondary | ICD-10-CM | POA: Diagnosis not present

## 2020-12-08 DIAGNOSIS — Z23 Encounter for immunization: Secondary | ICD-10-CM | POA: Diagnosis not present

## 2021-01-18 DIAGNOSIS — H524 Presbyopia: Secondary | ICD-10-CM | POA: Diagnosis not present

## 2021-05-17 DIAGNOSIS — I471 Supraventricular tachycardia: Secondary | ICD-10-CM | POA: Diagnosis not present

## 2021-05-17 DIAGNOSIS — I10 Essential (primary) hypertension: Secondary | ICD-10-CM | POA: Diagnosis not present

## 2021-05-30 DIAGNOSIS — H401132 Primary open-angle glaucoma, bilateral, moderate stage: Secondary | ICD-10-CM | POA: Diagnosis not present

## 2021-09-05 DIAGNOSIS — H401112 Primary open-angle glaucoma, right eye, moderate stage: Secondary | ICD-10-CM | POA: Diagnosis not present

## 2021-10-10 DIAGNOSIS — Z23 Encounter for immunization: Secondary | ICD-10-CM | POA: Diagnosis not present

## 2021-11-10 DIAGNOSIS — H18413 Arcus senilis, bilateral: Secondary | ICD-10-CM | POA: Diagnosis not present

## 2021-11-10 DIAGNOSIS — H401112 Primary open-angle glaucoma, right eye, moderate stage: Secondary | ICD-10-CM | POA: Diagnosis not present

## 2021-11-23 DIAGNOSIS — R5383 Other fatigue: Secondary | ICD-10-CM | POA: Diagnosis not present

## 2021-11-23 DIAGNOSIS — Z13220 Encounter for screening for lipoid disorders: Secondary | ICD-10-CM | POA: Diagnosis not present

## 2021-11-23 DIAGNOSIS — R7302 Impaired glucose tolerance (oral): Secondary | ICD-10-CM | POA: Diagnosis not present

## 2021-11-23 DIAGNOSIS — I471 Supraventricular tachycardia: Secondary | ICD-10-CM | POA: Diagnosis not present

## 2021-11-23 DIAGNOSIS — Z0000 Encounter for general adult medical examination without abnormal findings: Secondary | ICD-10-CM | POA: Diagnosis not present

## 2021-11-23 DIAGNOSIS — Z136 Encounter for screening for cardiovascular disorders: Secondary | ICD-10-CM | POA: Diagnosis not present

## 2022-12-06 DIAGNOSIS — E7800 Pure hypercholesterolemia, unspecified: Secondary | ICD-10-CM | POA: Diagnosis not present

## 2022-12-06 DIAGNOSIS — D649 Anemia, unspecified: Secondary | ICD-10-CM | POA: Diagnosis not present

## 2022-12-06 DIAGNOSIS — I10 Essential (primary) hypertension: Secondary | ICD-10-CM | POA: Diagnosis not present

## 2022-12-06 DIAGNOSIS — R7303 Prediabetes: Secondary | ICD-10-CM | POA: Diagnosis not present

## 2023-07-15 DEATH — deceased

## 2023-07-25 LAB — AMB RESULTS CONSOLE CBG: Glucose: 109

## 2023-07-25 NOTE — Progress Notes (Signed)
Patient presents to the screening Unit for bp, and glucose screening. She is currently not taking medication for Bp, or Diabetes. No concerns today.

## 2023-08-16 ENCOUNTER — Encounter: Payer: Self-pay | Admitting: *Deleted

## 2023-08-16 NOTE — Progress Notes (Signed)
Pt attended 07/25/23 screening event where her b/p was 138/73 and her blood sugar was 109. At the event, the pt states she had a PCP and pt did not identify any SDOH insecurities. During event f/u call, pt was unable to recall PCP name and stated "we have not been there lately- our regular dr passed away and I don't know the name of our new dr." Call to PCP clinic, [facility] confirms pt is a pt there and her original PCP Dr. Allena Katz did leave and she is currently assigned to Dr. Andee Poles although pt has not yet seen Dr. Deloria Lair. This info correlates with pt's memory that her original PCP left and she had been given another PCP. Chart review indicates pt had last physical exam on 12/06/22 with [facility] in [place], where she saw Dr. Sundra Aland and Dr. Catalina Lunger (who was listed as her PCP), although pt had seen Dr. Wyline Mood 11/23/21 and during 2022. Former PCP notes indicate pt denies memory loss and yet pt has dx of dementia. Chart review also indicates pt's PCP office has made multiple outreach calls, most recent being 08/09/23; but during phone call, pt denies having had any contact from PCP office  "in a while."  Pt states she does not take any medicine and currently has not SDOH needs or healthcare support needs, but confirms she and her husband do indeed have access to their PCP when needed (even though she cannot recall PCP name today.) No additional health equity team support indicated at this time since pt has no SDOH, does have a PCP and access to the PCP, and her event results were WNL and did not indicate a need for immediate f/u, and pt has no current acute healthcare needs.

## 2023-09-20 DIAGNOSIS — R7303 Prediabetes: Secondary | ICD-10-CM | POA: Diagnosis not present

## 2023-09-20 DIAGNOSIS — I10 Essential (primary) hypertension: Secondary | ICD-10-CM | POA: Diagnosis not present

## 2023-09-20 DIAGNOSIS — Z23 Encounter for immunization: Secondary | ICD-10-CM | POA: Diagnosis not present

## 2023-09-20 DIAGNOSIS — D649 Anemia, unspecified: Secondary | ICD-10-CM | POA: Diagnosis not present

## 2023-09-21 DIAGNOSIS — E7800 Pure hypercholesterolemia, unspecified: Secondary | ICD-10-CM | POA: Diagnosis not present

## 2023-09-21 DIAGNOSIS — R9389 Abnormal findings on diagnostic imaging of other specified body structures: Secondary | ICD-10-CM | POA: Diagnosis not present

## 2023-09-21 DIAGNOSIS — R7989 Other specified abnormal findings of blood chemistry: Secondary | ICD-10-CM | POA: Diagnosis not present

## 2023-09-21 DIAGNOSIS — I517 Cardiomegaly: Secondary | ICD-10-CM | POA: Diagnosis not present

## 2023-09-21 DIAGNOSIS — R0600 Dyspnea, unspecified: Secondary | ICD-10-CM | POA: Diagnosis not present

## 2023-09-21 DIAGNOSIS — D649 Anemia, unspecified: Secondary | ICD-10-CM | POA: Diagnosis not present

## 2023-09-21 DIAGNOSIS — R7303 Prediabetes: Secondary | ICD-10-CM | POA: Diagnosis not present

## 2023-09-21 DIAGNOSIS — I10 Essential (primary) hypertension: Secondary | ICD-10-CM | POA: Diagnosis not present

## 2023-09-21 DIAGNOSIS — Z87891 Personal history of nicotine dependence: Secondary | ICD-10-CM | POA: Diagnosis not present

## 2023-10-01 ENCOUNTER — Encounter (INDEPENDENT_AMBULATORY_CARE_PROVIDER_SITE_OTHER): Payer: Self-pay | Admitting: *Deleted

## 2023-10-02 ENCOUNTER — Encounter (INDEPENDENT_AMBULATORY_CARE_PROVIDER_SITE_OTHER): Payer: Self-pay | Admitting: *Deleted

## 2023-10-17 ENCOUNTER — Ambulatory Visit (INDEPENDENT_AMBULATORY_CARE_PROVIDER_SITE_OTHER): Payer: Medicare Other | Admitting: Gastroenterology

## 2024-01-18 DIAGNOSIS — D649 Anemia, unspecified: Secondary | ICD-10-CM | POA: Diagnosis not present

## 2024-01-18 DIAGNOSIS — G309 Alzheimer's disease, unspecified: Secondary | ICD-10-CM | POA: Diagnosis not present

## 2024-01-18 DIAGNOSIS — I10 Essential (primary) hypertension: Secondary | ICD-10-CM | POA: Diagnosis not present

## 2024-01-18 DIAGNOSIS — I471 Supraventricular tachycardia, unspecified: Secondary | ICD-10-CM | POA: Diagnosis not present

## 2024-01-18 DIAGNOSIS — D509 Iron deficiency anemia, unspecified: Secondary | ICD-10-CM | POA: Diagnosis not present

## 2024-01-18 DIAGNOSIS — E7800 Pure hypercholesterolemia, unspecified: Secondary | ICD-10-CM | POA: Diagnosis not present

## 2024-01-18 DIAGNOSIS — R7989 Other specified abnormal findings of blood chemistry: Secondary | ICD-10-CM | POA: Diagnosis not present

## 2024-02-20 DIAGNOSIS — J984 Other disorders of lung: Secondary | ICD-10-CM | POA: Diagnosis not present

## 2024-02-20 DIAGNOSIS — E872 Acidosis, unspecified: Secondary | ICD-10-CM | POA: Diagnosis not present

## 2024-02-20 DIAGNOSIS — I10 Essential (primary) hypertension: Secondary | ICD-10-CM | POA: Diagnosis not present

## 2024-02-20 DIAGNOSIS — E786 Lipoprotein deficiency: Secondary | ICD-10-CM | POA: Diagnosis not present

## 2024-02-20 DIAGNOSIS — N390 Urinary tract infection, site not specified: Secondary | ICD-10-CM | POA: Diagnosis not present

## 2024-02-20 DIAGNOSIS — T68XXXA Hypothermia, initial encounter: Secondary | ICD-10-CM | POA: Diagnosis not present

## 2024-02-20 DIAGNOSIS — A419 Sepsis, unspecified organism: Secondary | ICD-10-CM | POA: Diagnosis not present

## 2024-02-20 DIAGNOSIS — Z87891 Personal history of nicotine dependence: Secondary | ICD-10-CM | POA: Diagnosis not present

## 2024-02-20 DIAGNOSIS — R7401 Elevation of levels of liver transaminase levels: Secondary | ICD-10-CM | POA: Diagnosis not present

## 2024-02-20 DIAGNOSIS — N3000 Acute cystitis without hematuria: Secondary | ICD-10-CM | POA: Diagnosis not present

## 2024-02-20 DIAGNOSIS — Z792 Long term (current) use of antibiotics: Secondary | ICD-10-CM | POA: Diagnosis not present

## 2024-02-20 DIAGNOSIS — Z743 Need for continuous supervision: Secondary | ICD-10-CM | POA: Diagnosis not present

## 2024-02-20 DIAGNOSIS — G9341 Metabolic encephalopathy: Secondary | ICD-10-CM | POA: Diagnosis not present

## 2024-02-20 DIAGNOSIS — Z79899 Other long term (current) drug therapy: Secondary | ICD-10-CM | POA: Diagnosis not present

## 2024-02-20 DIAGNOSIS — I6523 Occlusion and stenosis of bilateral carotid arteries: Secondary | ICD-10-CM | POA: Diagnosis not present

## 2024-02-20 DIAGNOSIS — R7989 Other specified abnormal findings of blood chemistry: Secondary | ICD-10-CM | POA: Diagnosis not present

## 2024-02-20 DIAGNOSIS — R6883 Chills (without fever): Secondary | ICD-10-CM | POA: Diagnosis not present

## 2024-02-20 DIAGNOSIS — Z1152 Encounter for screening for COVID-19: Secondary | ICD-10-CM | POA: Diagnosis not present

## 2024-02-20 DIAGNOSIS — R6520 Severe sepsis without septic shock: Secondary | ICD-10-CM | POA: Diagnosis not present

## 2024-02-20 DIAGNOSIS — Z7982 Long term (current) use of aspirin: Secondary | ICD-10-CM | POA: Diagnosis not present

## 2024-02-20 DIAGNOSIS — J479 Bronchiectasis, uncomplicated: Secondary | ICD-10-CM | POA: Diagnosis not present

## 2024-02-20 DIAGNOSIS — E785 Hyperlipidemia, unspecified: Secondary | ICD-10-CM | POA: Diagnosis not present

## 2024-02-20 DIAGNOSIS — T699XXA Effect of reduced temperature, unspecified, initial encounter: Secondary | ICD-10-CM | POA: Diagnosis not present

## 2024-02-20 DIAGNOSIS — Z20822 Contact with and (suspected) exposure to covid-19: Secondary | ICD-10-CM | POA: Diagnosis not present

## 2024-03-27 DIAGNOSIS — E7800 Pure hypercholesterolemia, unspecified: Secondary | ICD-10-CM | POA: Diagnosis not present

## 2024-03-27 DIAGNOSIS — Z7982 Long term (current) use of aspirin: Secondary | ICD-10-CM | POA: Diagnosis not present

## 2024-04-03 DIAGNOSIS — Z79899 Other long term (current) drug therapy: Secondary | ICD-10-CM | POA: Diagnosis not present

## 2024-04-03 DIAGNOSIS — I10 Essential (primary) hypertension: Secondary | ICD-10-CM | POA: Diagnosis not present

## 2024-04-03 DIAGNOSIS — I471 Supraventricular tachycardia, unspecified: Secondary | ICD-10-CM | POA: Diagnosis not present

## 2024-04-03 DIAGNOSIS — Z7189 Other specified counseling: Secondary | ICD-10-CM | POA: Diagnosis not present

## 2024-04-03 DIAGNOSIS — R2689 Other abnormalities of gait and mobility: Secondary | ICD-10-CM | POA: Diagnosis not present

## 2024-04-03 DIAGNOSIS — D509 Iron deficiency anemia, unspecified: Secondary | ICD-10-CM | POA: Diagnosis not present

## 2024-04-03 DIAGNOSIS — M6281 Muscle weakness (generalized): Secondary | ICD-10-CM | POA: Diagnosis not present

## 2024-04-04 DIAGNOSIS — I471 Supraventricular tachycardia, unspecified: Secondary | ICD-10-CM | POA: Diagnosis not present

## 2024-04-04 DIAGNOSIS — R2689 Other abnormalities of gait and mobility: Secondary | ICD-10-CM | POA: Diagnosis not present

## 2024-04-04 DIAGNOSIS — M6281 Muscle weakness (generalized): Secondary | ICD-10-CM | POA: Diagnosis not present

## 2024-04-05 DIAGNOSIS — I471 Supraventricular tachycardia, unspecified: Secondary | ICD-10-CM | POA: Diagnosis not present

## 2024-04-05 DIAGNOSIS — R2689 Other abnormalities of gait and mobility: Secondary | ICD-10-CM | POA: Diagnosis not present

## 2024-04-05 DIAGNOSIS — M6281 Muscle weakness (generalized): Secondary | ICD-10-CM | POA: Diagnosis not present

## 2024-04-06 DIAGNOSIS — M6281 Muscle weakness (generalized): Secondary | ICD-10-CM | POA: Diagnosis not present

## 2024-04-06 DIAGNOSIS — I471 Supraventricular tachycardia, unspecified: Secondary | ICD-10-CM | POA: Diagnosis not present

## 2024-04-06 DIAGNOSIS — R2689 Other abnormalities of gait and mobility: Secondary | ICD-10-CM | POA: Diagnosis not present

## 2024-04-07 DIAGNOSIS — E7800 Pure hypercholesterolemia, unspecified: Secondary | ICD-10-CM | POA: Diagnosis not present

## 2024-04-07 DIAGNOSIS — I471 Supraventricular tachycardia, unspecified: Secondary | ICD-10-CM | POA: Diagnosis not present

## 2024-04-07 DIAGNOSIS — I10 Essential (primary) hypertension: Secondary | ICD-10-CM | POA: Diagnosis not present

## 2024-04-07 DIAGNOSIS — Z79899 Other long term (current) drug therapy: Secondary | ICD-10-CM | POA: Diagnosis not present

## 2024-04-07 DIAGNOSIS — E042 Nontoxic multinodular goiter: Secondary | ICD-10-CM | POA: Diagnosis not present

## 2024-04-07 DIAGNOSIS — D509 Iron deficiency anemia, unspecified: Secondary | ICD-10-CM | POA: Diagnosis not present

## 2024-04-07 DIAGNOSIS — M6281 Muscle weakness (generalized): Secondary | ICD-10-CM | POA: Diagnosis not present

## 2024-04-07 DIAGNOSIS — R2689 Other abnormalities of gait and mobility: Secondary | ICD-10-CM | POA: Diagnosis not present

## 2024-04-08 DIAGNOSIS — D509 Iron deficiency anemia, unspecified: Secondary | ICD-10-CM | POA: Diagnosis not present

## 2024-04-08 DIAGNOSIS — Z79899 Other long term (current) drug therapy: Secondary | ICD-10-CM | POA: Diagnosis not present

## 2024-04-08 DIAGNOSIS — I471 Supraventricular tachycardia, unspecified: Secondary | ICD-10-CM | POA: Diagnosis not present

## 2024-04-08 DIAGNOSIS — R2689 Other abnormalities of gait and mobility: Secondary | ICD-10-CM | POA: Diagnosis not present

## 2024-04-08 DIAGNOSIS — E042 Nontoxic multinodular goiter: Secondary | ICD-10-CM | POA: Diagnosis not present

## 2024-04-08 DIAGNOSIS — I10 Essential (primary) hypertension: Secondary | ICD-10-CM | POA: Diagnosis not present

## 2024-04-08 DIAGNOSIS — E7800 Pure hypercholesterolemia, unspecified: Secondary | ICD-10-CM | POA: Diagnosis not present

## 2024-04-08 DIAGNOSIS — M6281 Muscle weakness (generalized): Secondary | ICD-10-CM | POA: Diagnosis not present

## 2024-04-09 DIAGNOSIS — D509 Iron deficiency anemia, unspecified: Secondary | ICD-10-CM | POA: Diagnosis not present

## 2024-04-09 DIAGNOSIS — R2689 Other abnormalities of gait and mobility: Secondary | ICD-10-CM | POA: Diagnosis not present

## 2024-04-09 DIAGNOSIS — E042 Nontoxic multinodular goiter: Secondary | ICD-10-CM | POA: Diagnosis not present

## 2024-04-09 DIAGNOSIS — E7800 Pure hypercholesterolemia, unspecified: Secondary | ICD-10-CM | POA: Diagnosis not present

## 2024-04-09 DIAGNOSIS — I10 Essential (primary) hypertension: Secondary | ICD-10-CM | POA: Diagnosis not present

## 2024-04-09 DIAGNOSIS — I471 Supraventricular tachycardia, unspecified: Secondary | ICD-10-CM | POA: Diagnosis not present

## 2024-04-09 DIAGNOSIS — M6281 Muscle weakness (generalized): Secondary | ICD-10-CM | POA: Diagnosis not present

## 2024-04-10 DIAGNOSIS — E782 Mixed hyperlipidemia: Secondary | ICD-10-CM | POA: Diagnosis not present

## 2024-04-10 DIAGNOSIS — E559 Vitamin D deficiency, unspecified: Secondary | ICD-10-CM | POA: Diagnosis not present

## 2024-04-10 DIAGNOSIS — E038 Other specified hypothyroidism: Secondary | ICD-10-CM | POA: Diagnosis not present

## 2024-04-10 DIAGNOSIS — D513 Other dietary vitamin B12 deficiency anemia: Secondary | ICD-10-CM | POA: Diagnosis not present

## 2024-04-10 DIAGNOSIS — E119 Type 2 diabetes mellitus without complications: Secondary | ICD-10-CM | POA: Diagnosis not present

## 2024-04-12 DIAGNOSIS — R2689 Other abnormalities of gait and mobility: Secondary | ICD-10-CM | POA: Diagnosis not present

## 2024-04-12 DIAGNOSIS — I471 Supraventricular tachycardia, unspecified: Secondary | ICD-10-CM | POA: Diagnosis not present

## 2024-04-12 DIAGNOSIS — M6281 Muscle weakness (generalized): Secondary | ICD-10-CM | POA: Diagnosis not present

## 2024-04-13 DIAGNOSIS — M6281 Muscle weakness (generalized): Secondary | ICD-10-CM | POA: Diagnosis not present

## 2024-04-13 DIAGNOSIS — I471 Supraventricular tachycardia, unspecified: Secondary | ICD-10-CM | POA: Diagnosis not present

## 2024-04-13 DIAGNOSIS — R2689 Other abnormalities of gait and mobility: Secondary | ICD-10-CM | POA: Diagnosis not present

## 2024-04-14 DIAGNOSIS — R2689 Other abnormalities of gait and mobility: Secondary | ICD-10-CM | POA: Diagnosis not present

## 2024-04-14 DIAGNOSIS — I471 Supraventricular tachycardia, unspecified: Secondary | ICD-10-CM | POA: Diagnosis not present

## 2024-04-14 DIAGNOSIS — E876 Hypokalemia: Secondary | ICD-10-CM | POA: Diagnosis not present

## 2024-04-14 DIAGNOSIS — M6281 Muscle weakness (generalized): Secondary | ICD-10-CM | POA: Diagnosis not present

## 2024-04-14 DIAGNOSIS — E559 Vitamin D deficiency, unspecified: Secondary | ICD-10-CM | POA: Diagnosis not present

## 2024-04-14 DIAGNOSIS — Z79899 Other long term (current) drug therapy: Secondary | ICD-10-CM | POA: Diagnosis not present

## 2024-04-15 DIAGNOSIS — M6281 Muscle weakness (generalized): Secondary | ICD-10-CM | POA: Diagnosis not present

## 2024-04-15 DIAGNOSIS — I471 Supraventricular tachycardia, unspecified: Secondary | ICD-10-CM | POA: Diagnosis not present

## 2024-04-15 DIAGNOSIS — R2689 Other abnormalities of gait and mobility: Secondary | ICD-10-CM | POA: Diagnosis not present

## 2024-04-16 DIAGNOSIS — R2689 Other abnormalities of gait and mobility: Secondary | ICD-10-CM | POA: Diagnosis not present

## 2024-04-16 DIAGNOSIS — M6281 Muscle weakness (generalized): Secondary | ICD-10-CM | POA: Diagnosis not present

## 2024-04-16 DIAGNOSIS — I471 Supraventricular tachycardia, unspecified: Secondary | ICD-10-CM | POA: Diagnosis not present

## 2024-04-18 DIAGNOSIS — M6281 Muscle weakness (generalized): Secondary | ICD-10-CM | POA: Diagnosis not present

## 2024-04-18 DIAGNOSIS — I471 Supraventricular tachycardia, unspecified: Secondary | ICD-10-CM | POA: Diagnosis not present

## 2024-04-18 DIAGNOSIS — R2689 Other abnormalities of gait and mobility: Secondary | ICD-10-CM | POA: Diagnosis not present

## 2024-04-28 DIAGNOSIS — I10 Essential (primary) hypertension: Secondary | ICD-10-CM | POA: Diagnosis not present

## 2024-04-28 DIAGNOSIS — E7800 Pure hypercholesterolemia, unspecified: Secondary | ICD-10-CM | POA: Diagnosis not present

## 2024-04-28 DIAGNOSIS — Z79899 Other long term (current) drug therapy: Secondary | ICD-10-CM | POA: Diagnosis not present

## 2024-04-28 DIAGNOSIS — D509 Iron deficiency anemia, unspecified: Secondary | ICD-10-CM | POA: Diagnosis not present

## 2024-05-08 ENCOUNTER — Ambulatory Visit: Admitting: Obstetrics & Gynecology

## 2024-05-08 ENCOUNTER — Encounter: Payer: Self-pay | Admitting: Obstetrics & Gynecology

## 2024-05-08 VITALS — BP 176/75 | HR 49

## 2024-05-08 DIAGNOSIS — N814 Uterovaginal prolapse, unspecified: Secondary | ICD-10-CM | POA: Diagnosis not present

## 2024-05-08 NOTE — Progress Notes (Signed)
 Chief Complaint  Patient presents with   possible uterine prolapse    Blood pressure (!) 176/75, pulse (!) 49.  Taylor Perkins presents today for routine follow up related to her pessary.   Institutionalized due to dementia Caretakers noticed something hanging out Unsure of hysterectomy or not  Exam vaginal apex prolapse, post hysterectomy with cystocoele     ICD-10-CM   1. Cystocele with prolapse of the vaginal apex, s/p hysterectomy:  N81.4    Fit today for Milex ring with support #4      She reports no vaginal discharge and no vaginal bleeding   Likert scale(1 not bothersome -5 very bothersome)  :  1  Exam reveals no undue vaginal mucosal pressure of breakdown, no discharge and no vaginal bleeding.  Vaginal Epithelial Abnormality Classification System:   0 0    No abnormalities 1    Epithelial erythema 2    Granulation tissue 3    Epithelial break or erosion, 1 cm or less 4    Epithelial break or erosion, 1 cm or greater  The pessary is removed, cleaned and replaced without difficulty.      ICD-10-CM   1. Cystocele with prolapse of the vaginal apex, s/p hysterectomy:  N81.4    Fit today for Milex ring with support #4       Taylor Perkins will be sen back in 1 months for continued follow up.  Vonn VEAR Inch, MD  05/08/2024 11:37 AM

## 2024-05-12 DIAGNOSIS — Z79899 Other long term (current) drug therapy: Secondary | ICD-10-CM | POA: Diagnosis not present

## 2024-05-12 DIAGNOSIS — N8110 Cystocele, unspecified: Secondary | ICD-10-CM | POA: Diagnosis not present

## 2024-06-04 DIAGNOSIS — E042 Nontoxic multinodular goiter: Secondary | ICD-10-CM | POA: Diagnosis not present

## 2024-06-04 DIAGNOSIS — E559 Vitamin D deficiency, unspecified: Secondary | ICD-10-CM | POA: Diagnosis not present

## 2024-06-04 DIAGNOSIS — N8110 Cystocele, unspecified: Secondary | ICD-10-CM | POA: Diagnosis not present

## 2024-06-04 DIAGNOSIS — E7800 Pure hypercholesterolemia, unspecified: Secondary | ICD-10-CM | POA: Diagnosis not present

## 2024-06-04 DIAGNOSIS — D509 Iron deficiency anemia, unspecified: Secondary | ICD-10-CM | POA: Diagnosis not present

## 2024-06-04 DIAGNOSIS — E876 Hypokalemia: Secondary | ICD-10-CM | POA: Diagnosis not present

## 2024-06-04 DIAGNOSIS — I10 Essential (primary) hypertension: Secondary | ICD-10-CM | POA: Diagnosis not present

## 2024-06-04 DIAGNOSIS — Z79899 Other long term (current) drug therapy: Secondary | ICD-10-CM | POA: Diagnosis not present

## 2024-06-04 DIAGNOSIS — I471 Supraventricular tachycardia, unspecified: Secondary | ICD-10-CM | POA: Diagnosis not present

## 2024-06-10 ENCOUNTER — Ambulatory Visit: Admitting: Obstetrics & Gynecology

## 2024-06-10 ENCOUNTER — Encounter: Payer: Self-pay | Admitting: Obstetrics & Gynecology

## 2024-06-10 VITALS — BP 148/84

## 2024-06-10 DIAGNOSIS — N814 Uterovaginal prolapse, unspecified: Secondary | ICD-10-CM

## 2024-06-10 NOTE — Progress Notes (Signed)
 Chief Complaint  Patient presents with   Pessary Check    Blood pressure (!) 148/84.  Taylor Perkins presents today for routine follow up related to her pessary.   She uses a Milex ring with support #4, was not in vagina, refit to #5 She reports no vaginal discharge and no vaginal bleeding   Likert scale(1 not bothersome -5 very bothersome)  :  1  Exam reveals no undue vaginal mucosal pressure of breakdown, no discharge and no vaginal bleeding.  Vaginal Epithelial Abnormality Classification System:   0 0    No abnormalities 1    Epithelial erythema 2    Granulation tissue 3    Epithelial break or erosion, 1 cm or less 4    Epithelial break or erosion, 1 cm or greater  The pessary is removed, cleaned and replaced without difficulty.      ICD-10-CM   1. Cystocele with prolapse of the vaginal apex, s/p hysterectomy:  refit for Milex ring with support #5  N81.4    was not in vagina today       Taylor Matt Claudene will be sen back in 3 months for continued follow up.  Vonn VEAR Inch, MD  06/10/2024 12:28 PM

## 2024-06-17 ENCOUNTER — Telehealth: Payer: Self-pay | Admitting: Obstetrics & Gynecology

## 2024-06-17 NOTE — Telephone Encounter (Signed)
 [facility] called stating that the device that was placed last week in Taylor Perkins came out this morning, they are wanting to know what to do. They said they thought they were told if that didn't work the next steps would be surgery. They are wanting to know if she needs to come back in to have  device replace or next steps. Jacob Creeks # [phone] per Jerelene Gaskins.

## 2024-06-17 NOTE — Telephone Encounter (Signed)
 Called nursing home- reassured them that pessary being out is ok.  Plan to schedule follow up with Dr. Jayne at next available

## 2024-06-19 DIAGNOSIS — M6281 Muscle weakness (generalized): Secondary | ICD-10-CM | POA: Diagnosis not present

## 2024-06-19 DIAGNOSIS — R2689 Other abnormalities of gait and mobility: Secondary | ICD-10-CM | POA: Diagnosis not present

## 2024-06-19 DIAGNOSIS — I471 Supraventricular tachycardia, unspecified: Secondary | ICD-10-CM | POA: Diagnosis not present

## 2024-06-20 DIAGNOSIS — M6281 Muscle weakness (generalized): Secondary | ICD-10-CM | POA: Diagnosis not present

## 2024-06-20 DIAGNOSIS — R2689 Other abnormalities of gait and mobility: Secondary | ICD-10-CM | POA: Diagnosis not present

## 2024-06-20 DIAGNOSIS — I471 Supraventricular tachycardia, unspecified: Secondary | ICD-10-CM | POA: Diagnosis not present

## 2024-06-23 DIAGNOSIS — R2689 Other abnormalities of gait and mobility: Secondary | ICD-10-CM | POA: Diagnosis not present

## 2024-06-23 DIAGNOSIS — M6281 Muscle weakness (generalized): Secondary | ICD-10-CM | POA: Diagnosis not present

## 2024-06-24 DIAGNOSIS — M6281 Muscle weakness (generalized): Secondary | ICD-10-CM | POA: Diagnosis not present

## 2024-06-24 DIAGNOSIS — R2689 Other abnormalities of gait and mobility: Secondary | ICD-10-CM | POA: Diagnosis not present

## 2024-06-25 DIAGNOSIS — M6281 Muscle weakness (generalized): Secondary | ICD-10-CM | POA: Diagnosis not present

## 2024-06-25 DIAGNOSIS — R2689 Other abnormalities of gait and mobility: Secondary | ICD-10-CM | POA: Diagnosis not present

## 2024-06-26 DIAGNOSIS — R2689 Other abnormalities of gait and mobility: Secondary | ICD-10-CM | POA: Diagnosis not present

## 2024-06-26 DIAGNOSIS — M6281 Muscle weakness (generalized): Secondary | ICD-10-CM | POA: Diagnosis not present

## 2024-06-26 DIAGNOSIS — I471 Supraventricular tachycardia, unspecified: Secondary | ICD-10-CM | POA: Diagnosis not present

## 2024-06-27 DIAGNOSIS — I471 Supraventricular tachycardia, unspecified: Secondary | ICD-10-CM | POA: Diagnosis not present

## 2024-06-27 DIAGNOSIS — M6281 Muscle weakness (generalized): Secondary | ICD-10-CM | POA: Diagnosis not present

## 2024-06-27 DIAGNOSIS — R2689 Other abnormalities of gait and mobility: Secondary | ICD-10-CM | POA: Diagnosis not present

## 2024-06-29 DIAGNOSIS — M6281 Muscle weakness (generalized): Secondary | ICD-10-CM | POA: Diagnosis not present

## 2024-06-29 DIAGNOSIS — R2689 Other abnormalities of gait and mobility: Secondary | ICD-10-CM | POA: Diagnosis not present

## 2024-06-30 DIAGNOSIS — M6281 Muscle weakness (generalized): Secondary | ICD-10-CM | POA: Diagnosis not present

## 2024-06-30 DIAGNOSIS — R2689 Other abnormalities of gait and mobility: Secondary | ICD-10-CM | POA: Diagnosis not present

## 2024-07-01 DIAGNOSIS — M6281 Muscle weakness (generalized): Secondary | ICD-10-CM | POA: Diagnosis not present

## 2024-07-01 DIAGNOSIS — R2689 Other abnormalities of gait and mobility: Secondary | ICD-10-CM | POA: Diagnosis not present

## 2024-07-02 DIAGNOSIS — R2689 Other abnormalities of gait and mobility: Secondary | ICD-10-CM | POA: Diagnosis not present

## 2024-07-02 DIAGNOSIS — D509 Iron deficiency anemia, unspecified: Secondary | ICD-10-CM | POA: Diagnosis not present

## 2024-07-02 DIAGNOSIS — E559 Vitamin D deficiency, unspecified: Secondary | ICD-10-CM | POA: Diagnosis not present

## 2024-07-02 DIAGNOSIS — E042 Nontoxic multinodular goiter: Secondary | ICD-10-CM | POA: Diagnosis not present

## 2024-07-02 DIAGNOSIS — M6281 Muscle weakness (generalized): Secondary | ICD-10-CM | POA: Diagnosis not present

## 2024-07-02 DIAGNOSIS — I10 Essential (primary) hypertension: Secondary | ICD-10-CM | POA: Diagnosis not present

## 2024-07-02 DIAGNOSIS — E7800 Pure hypercholesterolemia, unspecified: Secondary | ICD-10-CM | POA: Diagnosis not present

## 2024-07-02 DIAGNOSIS — I471 Supraventricular tachycardia, unspecified: Secondary | ICD-10-CM | POA: Diagnosis not present

## 2024-07-03 DIAGNOSIS — M6281 Muscle weakness (generalized): Secondary | ICD-10-CM | POA: Diagnosis not present

## 2024-07-03 DIAGNOSIS — R2689 Other abnormalities of gait and mobility: Secondary | ICD-10-CM | POA: Diagnosis not present

## 2024-07-07 DIAGNOSIS — M6281 Muscle weakness (generalized): Secondary | ICD-10-CM | POA: Diagnosis not present

## 2024-07-07 DIAGNOSIS — R2689 Other abnormalities of gait and mobility: Secondary | ICD-10-CM | POA: Diagnosis not present

## 2024-07-08 ENCOUNTER — Ambulatory Visit (INDEPENDENT_AMBULATORY_CARE_PROVIDER_SITE_OTHER): Admitting: Obstetrics & Gynecology

## 2024-07-08 VITALS — BP 131/83 | HR 80

## 2024-07-08 DIAGNOSIS — N814 Uterovaginal prolapse, unspecified: Secondary | ICD-10-CM

## 2024-07-08 NOTE — Progress Notes (Signed)
 Chief Complaint  Patient presents with   Follow-up    Blood pressure 131/83, pulse 80.  Taylor Perkins presents today for routine follow up related to her pessary.   She uses a Milex ring with support #5, came out after her Milex ring #4 came out Fit for Gelhorn 2 1/2 inch today She reports no vaginal discharge and no vaginal bleeding   Not a surgical candidate  Likert scale(1 not bothersome -5 very bothersome)  :  1  Exam reveals no undue vaginal mucosal pressure of breakdown, no discharge and no vaginal bleeding.  Vaginal Epithelial Abnormality Classification System:   0 0    No abnormalities 1    Epithelial erythema 2    Granulation tissue 3    Epithelial break or erosion, 1 cm or less 4    Epithelial break or erosion, 1 cm or greater  The pessary is removed, cleaned and replaced without difficulty.      ICD-10-CM   1. Cystocele with prolapse of the vaginal apex, s/p hysterectomy:  failed Milex 4 and 5, Gelhorn 2 1/2 inch fit  07/08/24  N81.4        Taylor Matt Claudene will be sen back in 1 months for continued follow up.  Vonn VEAR Inch, MD  07/08/2024 11:02 AM

## 2024-07-21 DIAGNOSIS — I10 Essential (primary) hypertension: Secondary | ICD-10-CM | POA: Diagnosis not present

## 2024-07-21 DIAGNOSIS — E7800 Pure hypercholesterolemia, unspecified: Secondary | ICD-10-CM | POA: Diagnosis not present

## 2024-07-21 DIAGNOSIS — Z79899 Other long term (current) drug therapy: Secondary | ICD-10-CM | POA: Diagnosis not present

## 2024-08-05 ENCOUNTER — Encounter: Payer: Self-pay | Admitting: Obstetrics & Gynecology

## 2024-08-05 ENCOUNTER — Ambulatory Visit: Admitting: Obstetrics & Gynecology

## 2024-08-05 VITALS — BP 143/89

## 2024-08-05 DIAGNOSIS — Z90710 Acquired absence of both cervix and uterus: Secondary | ICD-10-CM

## 2024-08-05 DIAGNOSIS — N8110 Cystocele, unspecified: Secondary | ICD-10-CM

## 2024-08-05 DIAGNOSIS — N814 Uterovaginal prolapse, unspecified: Secondary | ICD-10-CM

## 2024-08-05 NOTE — Progress Notes (Signed)
 Chief Complaint  Patient presents with   Pessary Check    Blood pressure (!) 143/89.  Taylor Perkins presents today for routine follow up related to her pessary.   She uses a Gelhorn pessary 2 1/2 inch She reports no vaginal discharge and no vaginal bleeding   Likert scale(1 not bothersome -5 very bothersome)  :  1  Exam reveals no undue vaginal mucosal pressure of breakdown, no discharge and no vaginal bleeding.  Vaginal Epithelial Abnormality Classification System:   0 0    No abnormalities 1    Epithelial erythema 2    Granulation tissue 3    Epithelial break or erosion, 1 cm or less 4    Epithelial break or erosion, 1 cm or greater  The pessary is removed, cleaned and replaced without difficulty.      ICD-10-CM   1. Cystocele with prolapse of the vaginal apex, s/p hysterectomy:  failed Milex 4 and 5, Gelhorn 2 1/2 inch fit  07/08/24  N81.4       This pessary has stayed in and is effective  Taylor Perkins will be sen back in 3 months for continued follow up.  Vonn VEAR Inch, MD  08/05/2024 8:52 AM

## 2024-11-04 ENCOUNTER — Ambulatory Visit: Admitting: Obstetrics & Gynecology

## 2024-11-28 ENCOUNTER — Encounter: Payer: Self-pay | Admitting: Obstetrics & Gynecology

## 2024-11-28 ENCOUNTER — Ambulatory Visit (INDEPENDENT_AMBULATORY_CARE_PROVIDER_SITE_OTHER): Admitting: Obstetrics & Gynecology

## 2024-11-28 VITALS — BP 153/86 | HR 80 | Wt 152.0 lb

## 2024-11-28 DIAGNOSIS — N814 Uterovaginal prolapse, unspecified: Secondary | ICD-10-CM | POA: Diagnosis not present

## 2024-11-28 NOTE — Progress Notes (Signed)
 Chief Complaint  Patient presents with   Pessary Check    Blood pressure (!) 153/86, pulse 80, weight 152 lb (68.9 kg).  Taylor Perkins presents today for routine follow up related to her pessary.   She uses a Gelhorn 2 1/2 inch She reports no vaginal discharge and no vaginal bleeding   Likert scale(1 not bothersome -5 very bothersome)  :  1  Exam reveals no undue vaginal mucosal pressure of breakdown, no discharge and no vaginal bleeding.  Vaginal Epithelial Abnormality Classification System:   0 0    No abnormalities 1    Epithelial erythema 2    Granulation tissue 3    Epithelial break or erosion, 1 cm or less 4    Epithelial break or erosion, 1 cm or greater  The pessary is removed, cleaned and replaced without difficulty.      ICD-10-CM   1. Cystocele with prolapse of the vaginal apex, s/p hysterectomy:  failed Milex 4 and 5, Gelhorn 2 1/2 inch fit  07/08/24  N81.4        Taylor Matt Claudene will be sen back in 6 months for continued follow up.  Vonn VEAR Inch, MD  11/28/2024 9:04 AM
